# Patient Record
Sex: Female | Born: 1995 | Race: Black or African American | Hispanic: No | Marital: Single | Smoking: Current some day smoker
Health system: Southern US, Community
[De-identification: ages and names within clinical notes are randomized; demographics above are authoritative.]

---

## 2016-12-13 ENCOUNTER — Encounter (HOSPITAL_COMMUNITY): Payer: Self-pay | Admitting: Emergency Medicine

## 2016-12-13 DIAGNOSIS — Z87820 Personal history of traumatic brain injury: Secondary | ICD-10-CM | POA: Insufficient documentation

## 2016-12-13 DIAGNOSIS — M549 Dorsalgia, unspecified: Secondary | ICD-10-CM | POA: Diagnosis not present

## 2016-12-13 DIAGNOSIS — Y92410 Unspecified street and highway as the place of occurrence of the external cause: Secondary | ICD-10-CM | POA: Insufficient documentation

## 2016-12-13 DIAGNOSIS — M542 Cervicalgia: Secondary | ICD-10-CM | POA: Diagnosis not present

## 2016-12-13 DIAGNOSIS — Y9389 Activity, other specified: Secondary | ICD-10-CM | POA: Insufficient documentation

## 2016-12-13 DIAGNOSIS — M79642 Pain in left hand: Secondary | ICD-10-CM | POA: Diagnosis not present

## 2016-12-13 DIAGNOSIS — R6884 Jaw pain: Secondary | ICD-10-CM | POA: Diagnosis not present

## 2016-12-13 DIAGNOSIS — M25522 Pain in left elbow: Secondary | ICD-10-CM | POA: Insufficient documentation

## 2016-12-13 DIAGNOSIS — F17200 Nicotine dependence, unspecified, uncomplicated: Secondary | ICD-10-CM | POA: Diagnosis not present

## 2016-12-13 DIAGNOSIS — M25561 Pain in right knee: Secondary | ICD-10-CM | POA: Diagnosis not present

## 2016-12-13 DIAGNOSIS — Y998 Other external cause status: Secondary | ICD-10-CM | POA: Diagnosis not present

## 2016-12-13 DIAGNOSIS — R51 Headache: Secondary | ICD-10-CM | POA: Diagnosis not present
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
## 2016-12-14 ENCOUNTER — Emergency Department (HOSPITAL_COMMUNITY): Payer: No Typology Code available for payment source

## 2016-12-14 ENCOUNTER — Emergency Department (HOSPITAL_COMMUNITY)
Admission: EM | Admit: 2016-12-14 | Discharge: 2016-12-14 | Disposition: A | Discharge status: Home / Self Care | Payer: No Typology Code available for payment source | Attending: Emergency Medicine | Admitting: Emergency Medicine

## 2016-12-14 MED ORDER — IBUPROFEN 800 MG PO TABS: 800.0000 mg | ORAL_TABLET | Freq: Three times a day (TID) | ORAL | 0 refills | Status: AC

## 2016-12-14 MED ORDER — ACETAMINOPHEN 500 MG PO TABS: 500.0000 mg | ORAL_TABLET | Freq: Four times a day (QID) | ORAL | 0 refills | Status: AC | PRN

## 2016-12-14 MED ORDER — METHOCARBAMOL 500 MG PO TABS: 500.0000 mg | ORAL_TABLET | Freq: Two times a day (BID) | ORAL | 0 refills | Status: AC
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
## 2016-12-14 NOTE — ED Notes (Signed)
Unable to get signature due to sig pad not working.  Verbalized understanding of discharge.
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
